# Patient Record
Sex: Female | Born: 1980 | Race: Black or African American | Hispanic: No | Marital: Married | Smoking: Never smoker
Health system: Southern US, Community
[De-identification: ages and names within clinical notes are randomized; demographics above are authoritative.]

## PROBLEM LIST (undated history)

## (undated) DIAGNOSIS — R51 Headache: Secondary | ICD-10-CM

## (undated) DIAGNOSIS — N840 Polyp of corpus uteri: Secondary | ICD-10-CM

## (undated) DIAGNOSIS — Z8742 Personal history of other diseases of the female genital tract: Secondary | ICD-10-CM

## (undated) DIAGNOSIS — N870 Mild cervical dysplasia: Secondary | ICD-10-CM

## (undated) DIAGNOSIS — IMO0002 Reserved for concepts with insufficient information to code with codable children: Secondary | ICD-10-CM

## (undated) DIAGNOSIS — Z87440 Personal history of urinary (tract) infections: Secondary | ICD-10-CM

## (undated) DIAGNOSIS — N83209 Unspecified ovarian cyst, unspecified side: Secondary | ICD-10-CM

## (undated) DIAGNOSIS — Z8619 Personal history of other infectious and parasitic diseases: Secondary | ICD-10-CM

## (undated) DIAGNOSIS — N979 Female infertility, unspecified: Secondary | ICD-10-CM

## (undated) DIAGNOSIS — B379 Candidiasis, unspecified: Secondary | ICD-10-CM

## (undated) DIAGNOSIS — B977 Papillomavirus as the cause of diseases classified elsewhere: Secondary | ICD-10-CM

## (undated) HISTORY — DX: Personal history of urinary (tract) infections: Z87.440

## (undated) HISTORY — DX: Reserved for concepts with insufficient information to code with codable children: IMO0002

## (undated) HISTORY — DX: Personal history of other infectious and parasitic diseases: Z86.19

## (undated) HISTORY — DX: Female infertility, unspecified: N97.9

## (undated) HISTORY — DX: Personal history of other diseases of the female genital tract: Z87.42

## (undated) HISTORY — DX: Polyp of corpus uteri: N84.0

## (undated) HISTORY — DX: Mild cervical dysplasia: N87.0

## (undated) HISTORY — DX: Headache: R51

## (undated) HISTORY — DX: Candidiasis, unspecified: B37.9

## (undated) HISTORY — PX: OTHER SURGICAL HISTORY: SHX169

## (undated) HISTORY — DX: Unspecified ovarian cyst, unspecified side: N83.209

## (undated) HISTORY — DX: Papillomavirus as the cause of diseases classified elsewhere: B97.7

## (undated) HISTORY — PX: WISDOM TOOTH EXTRACTION: SHX21

---

## 1999-11-09 ENCOUNTER — Other Ambulatory Visit: Admission: RE | Admit: 1999-11-09 | Discharge: 1999-11-09 | Payer: Self-pay | Admitting: Family Medicine

## 2000-12-24 ENCOUNTER — Other Ambulatory Visit: Admission: RE | Admit: 2000-12-24 | Discharge: 2000-12-24 | Payer: Self-pay | Admitting: Family Medicine

## 2004-01-26 ENCOUNTER — Ambulatory Visit (HOSPITAL_COMMUNITY): Admission: RE | Admit: 2004-01-26 | Discharge: 2004-01-26 | Payer: Self-pay | Admitting: Obstetrics & Gynecology

## 2004-03-03 ENCOUNTER — Ambulatory Visit (HOSPITAL_COMMUNITY): Admission: RE | Admit: 2004-03-03 | Discharge: 2004-03-03 | Payer: Self-pay | Admitting: Obstetrics & Gynecology

## 2005-02-12 DIAGNOSIS — B977 Papillomavirus as the cause of diseases classified elsewhere: Secondary | ICD-10-CM

## 2005-02-12 DIAGNOSIS — N870 Mild cervical dysplasia: Secondary | ICD-10-CM

## 2005-02-12 HISTORY — DX: Mild cervical dysplasia: N87.0

## 2005-02-12 HISTORY — DX: Papillomavirus as the cause of diseases classified elsewhere: B97.7

## 2005-03-15 DIAGNOSIS — IMO0002 Reserved for concepts with insufficient information to code with codable children: Secondary | ICD-10-CM

## 2005-03-15 HISTORY — DX: Reserved for concepts with insufficient information to code with codable children: IMO0002

## 2005-08-20 ENCOUNTER — Other Ambulatory Visit: Admission: RE | Admit: 2005-08-20 | Discharge: 2005-08-20 | Payer: Self-pay | Admitting: Obstetrics and Gynecology

## 2008-02-13 DIAGNOSIS — Z8742 Personal history of other diseases of the female genital tract: Secondary | ICD-10-CM

## 2008-02-13 HISTORY — DX: Personal history of other diseases of the female genital tract: Z87.42

## 2008-03-02 ENCOUNTER — Encounter: Admission: RE | Admit: 2008-03-02 | Discharge: 2008-03-02 | Payer: Self-pay

## 2009-08-26 ENCOUNTER — Ambulatory Visit (HOSPITAL_COMMUNITY): Admission: RE | Admit: 2009-08-26 | Discharge: 2009-08-26 | Payer: Self-pay | Admitting: Obstetrics and Gynecology

## 2009-09-10 ENCOUNTER — Emergency Department (HOSPITAL_COMMUNITY): Admission: EM | Admit: 2009-09-10 | Discharge: 2009-09-10 | Payer: Self-pay | Admitting: Family Medicine

## 2009-09-16 ENCOUNTER — Ambulatory Visit (HOSPITAL_COMMUNITY): Admission: RE | Admit: 2009-09-16 | Discharge: 2009-09-16 | Payer: Self-pay | Admitting: Obstetrics and Gynecology

## 2010-04-28 LAB — PREGNANCY, URINE: Preg Test, Ur: NEGATIVE

## 2010-04-29 LAB — URINE CULTURE
Colony Count: 50000
Culture  Setup Time: 201107302133

## 2010-04-29 LAB — POCT URINALYSIS DIP (DEVICE)
Bilirubin Urine: NEGATIVE
Glucose, UA: NEGATIVE mg/dL
Ketones, ur: NEGATIVE mg/dL
Nitrite: NEGATIVE
Protein, ur: NEGATIVE mg/dL
Specific Gravity, Urine: 1.02 (ref 1.005–1.030)
Urobilinogen, UA: 0.2 mg/dL (ref 0.0–1.0)
pH: 7 (ref 5.0–8.0)

## 2010-04-29 LAB — POCT PREGNANCY, URINE: Preg Test, Ur: NEGATIVE

## 2010-08-13 ENCOUNTER — Inpatient Hospital Stay (HOSPITAL_COMMUNITY)
Admission: AD | Admit: 2010-08-13 | Discharge: 2010-08-16 | DRG: 373 | Disposition: A | Discharge status: Home / Self Care | Payer: BC Managed Care – PPO | Source: Ambulatory Visit | Attending: Obstetrics and Gynecology | Admitting: Obstetrics and Gynecology

## 2010-08-13 LAB — COMPREHENSIVE METABOLIC PANEL
ALT: 23 U/L (ref 0–35)
AST: 18 U/L (ref 0–37)
Alkaline Phosphatase: 107 U/L (ref 39–117)
CO2: 18 mEq/L — ABNORMAL LOW (ref 19–32)
Calcium: 8.9 mg/dL (ref 8.4–10.5)
Chloride: 99 mEq/L (ref 96–112)
Creatinine, Ser: 0.5 mg/dL (ref 0.50–1.10)
GFR calc Af Amer: >60 mL/min (ref >60)
GFR calc non Af Amer: >60 mL/min (ref >60)
Glucose, Bld: 83 mg/dL (ref 70–99)
Potassium: 3.4 mEq/L — ABNORMAL LOW (ref 3.5–5.1)
Sodium: 132 mEq/L — ABNORMAL LOW (ref 135–145)
Total Protein: 6.8 g/dL (ref 6.0–8.3)

## 2010-08-13 LAB — LACTATE DEHYDROGENASE: LDH: 181 U/L (ref 94–250)

## 2010-08-13 LAB — CBC
HCT: 36.1 % (ref 36.0–46.0)
Hemoglobin: 12.1 g/dL (ref 12.0–15.0)
MCH: 31.4 pg (ref 26.0–34.0)
MCHC: 33.5 g/dL (ref 30.0–36.0)
Platelets: 176 10*3/uL (ref 150–400)
RBC: 3.85 MIL/uL — ABNORMAL LOW (ref 3.87–5.11)
RDW: 13.3 % (ref 11.5–15.5)

## 2010-08-13 LAB — URIC ACID: Uric Acid, Serum: 3.5 mg/dL (ref 2.4–7.0)

## 2010-08-13 LAB — RPR: RPR Ser Ql: NONREACTIVE

## 2010-08-15 LAB — URIC ACID: Uric Acid, Serum: 4 mg/dL (ref 2.4–7.0)

## 2010-08-15 LAB — COMPREHENSIVE METABOLIC PANEL
ALT: 33 U/L (ref 0–35)
AST: 42 U/L — ABNORMAL HIGH (ref 0–37)
Albumin: 2.1 g/dL — ABNORMAL LOW (ref 3.5–5.2)
Alkaline Phosphatase: 80 U/L (ref 39–117)
BUN: 8 mg/dL (ref 6–23)
CO2: 24 mEq/L (ref 19–32)
Calcium: 8.8 mg/dL (ref 8.4–10.5)
Creatinine, Ser: 0.58 mg/dL (ref 0.50–1.10)
GFR calc Af Amer: >60 mL/min (ref >60)
GFR calc non Af Amer: >60 mL/min (ref >60)
Glucose, Bld: 80 mg/dL (ref 70–99)
Potassium: 3.9 mEq/L (ref 3.5–5.1)
Sodium: 136 mEq/L (ref 135–145)
Total Bilirubin: 0.4 mg/dL (ref 0.3–1.2)
Total Protein: 5.7 g/dL — ABNORMAL LOW (ref 6.0–8.3)

## 2010-08-15 LAB — CBC
HCT: 29.9 % — ABNORMAL LOW (ref 36.0–46.0)
Hemoglobin: 9.9 g/dL — ABNORMAL LOW (ref 12.0–15.0)
MCH: 31.6 pg (ref 26.0–34.0)
MCHC: 33.1 g/dL (ref 30.0–36.0)
MCV: 95.5 fL (ref 78.0–100.0)
Platelets: 186 10*3/uL (ref 150–400)
RBC: 3.13 MIL/uL — ABNORMAL LOW (ref 3.87–5.11)
RDW: 13.6 % (ref 11.5–15.5)
WBC: 13 10*3/uL — ABNORMAL HIGH (ref 4.0–10.5)

## 2010-08-15 LAB — LACTATE DEHYDROGENASE: LDH: 268 U/L — ABNORMAL HIGH (ref 94–250)

## 2011-09-04 ENCOUNTER — Other Ambulatory Visit: Payer: Self-pay | Admitting: Obstetrics and Gynecology

## 2011-09-04 NOTE — Telephone Encounter (Signed)
Triage/rx °

## 2011-09-04 NOTE — Telephone Encounter (Signed)
Tc to pt regarding msg, lm on vm to call back. 

## 2011-09-05 ENCOUNTER — Other Ambulatory Visit: Payer: Self-pay | Admitting: Obstetrics and Gynecology

## 2011-09-05 ENCOUNTER — Telehealth: Payer: Self-pay | Admitting: Obstetrics and Gynecology

## 2011-09-05 MED ORDER — NORETHINDRONE 0.35 MG PO TABS: 1.0000 | ORAL_TABLET | Freq: Every day | ORAL | Status: DC

## 2011-09-05 NOTE — Telephone Encounter (Signed)
Pt rtnd call, states needs rf of Camila, was to start Rx on Sunday, has not had any unprotected IC since then, pt sched AEX for Monday 10/01/11, pt given 1 rf to last until appt, pt to double up on pills until she is caught up on pills, pt voices understanding.

## 2011-09-05 NOTE — Telephone Encounter (Signed)
Triage/repeat call about script.

## 2011-09-27 ENCOUNTER — Telehealth: Payer: Self-pay | Admitting: Obstetrics and Gynecology

## 2011-09-27 MED ORDER — NORETHINDRONE 0.35 MG PO TABS: 1.0000 | ORAL_TABLET | Freq: Every day | ORAL | Status: DC

## 2011-09-27 NOTE — Telephone Encounter (Signed)
Spoke with pt Helen Branch msg pt states need refill on bc camilla until appt 10/01/11 informed pt per protocol rx sent to pharm pt voice understanding

## 2011-10-01 ENCOUNTER — Ambulatory Visit (INDEPENDENT_AMBULATORY_CARE_PROVIDER_SITE_OTHER): Payer: BC Managed Care – PPO | Admitting: Obstetrics and Gynecology

## 2011-10-01 ENCOUNTER — Encounter: Payer: Self-pay | Admitting: Obstetrics and Gynecology

## 2011-10-01 VITALS — BP 110/60 | HR 86 | Resp 16 | Ht 68.0 in | Wt 148.0 lb

## 2011-10-01 DIAGNOSIS — IMO0001 Reserved for inherently not codable concepts without codable children: Secondary | ICD-10-CM

## 2011-10-01 DIAGNOSIS — Z124 Encounter for screening for malignant neoplasm of cervix: Secondary | ICD-10-CM

## 2011-10-01 DIAGNOSIS — Z309 Encounter for contraceptive management, unspecified: Secondary | ICD-10-CM

## 2011-10-01 DIAGNOSIS — Z01419 Encounter for gynecological examination (general) (routine) without abnormal findings: Secondary | ICD-10-CM

## 2011-10-01 MED ORDER — NORETHINDRONE 0.35 MG PO TABS: 1.0000 | ORAL_TABLET | Freq: Every day | ORAL | Status: DC

## 2011-10-01 MED ORDER — NORETHINDRONE-ETH ESTRADIOL 0.5-35 MG-MCG PO TABS: 1.0000 | ORAL_TABLET | Freq: Every day | ORAL | Status: AC

## 2011-10-01 NOTE — Progress Notes (Signed)
Regular Periods: yes Mammogram: no  Monthly Breast Ex.: yes Exercise: no  Tetanus < 10 years: no Seatbelts: yes  NI. Bladder Functn.: yes Abuse at home: no  Daily BM's: yes Stressful Work: no "pt starts back tomorrow"  Healthy Diet: yes Sigmoid-Colonoscopy: N/A   Calcium: no Medical problems this year: N/A    LAST PAP:09/26/2010 "WNL"  Contraception: Camilla  Mammogram:  Never  PCP: Rubbie Battiest  PMH: No Changes  FMH: No Changes  Last Bone Scan: Never.  ANNUAL GYNECOLOGIC EXAMINATION   Helen Branch is a 31 y.o. female, G1P1001, who presents for an annual exam. See above. She is doing well.  Prior Hysterectomy: No    History   Social History  . Marital Status: Married    Spouse Name: N/A    Number of Children: N/A  . Years of Education: N/A   Social History Main Topics  . Smoking status: Never Smoker   . Smokeless tobacco: Never Used  . Alcohol Use: Yes  . Drug Use: No  . Sexually Active: Yes    Birth Control/ Protection: Pill   Other Topics Concern  . None   Social History Narrative  . None    Menstrual cycle:   LMP: Patient's last menstrual period was 09/09/2011.             The following portions of the patient's history were reviewed and updated as appropriate: allergies, current medications, past family history, past medical history, past social history, past surgical history and problem list.  Review of Systems Pertinent items are noted in HPI. Breast:Negative for breast lump,nipple discharge or nipple retraction Gastrointestinal: Negative for abdominal pain, change in bowel habits or rectal bleeding Urinary:negative   Objective:    BP 110/60  Pulse 86  Resp 16  Ht 5\' 8"  (1.727 m)  Wt 148 lb (67.132 kg)  BMI 22.50 kg/m2  LMP 09/09/2011  Breastfeeding? Yes    Weight:  Wt Readings from Last 1 Encounters:  10/01/11 148 lb (67.132 kg)          BMI: Body mass index is 22.50 kg/(m^2).  General Appearance: Alert, appropriate  appearance for age. No acute distress HEENT: Grossly normal Neck / Thyroid: Supple, no masses, nodes or enlargement Lungs: clear to auscultation bilaterally Back: No CVA tenderness Breast Exam: No masses or nodes.No dimpling, nipple retraction or discharge. Cardiovascular: Regular rate and rhythm. S1, S2, no murmur Gastrointestinal: Soft, non-tender, no masses or organomegaly  ++++++++++++++++++++++++++++++++++++++++++++++++++++++++  Pelvic Exam: External genitalia: normal general appearance Vaginal: normal without tenderness, induration or masses and relaxation: Yes Cervix: normal appearance Adnexa: normal bimanual exam Uterus: normal size, shape, and consistency Rectovaginal: not indicated  ++++++++++++++++++++++++++++++++++++++++++++++++++++++++  Lymphatic Exam: Non-palpable nodes in neck, clavicular, axillary, or inguinal regions Neurologic: Normal speech, no tremor  Psychiatric: Alert and oriented, appropriate affect.   Wet Prep:   not applicable Urinalysis:  not applicable UPT:           Not done   Assessment:    Normal gyn exam   Overweight or obese: No   Pelvic relaxation: Yes  Contraception... The patient is still breast-feeding.   Plan:    pap smear return annually or prn Contraception:oral contraceptives (estrogen/progesterone), oral progesterone-only contraceptive    Medications prescribed: Micronor for contraception right now.  Switched to Loestrin 1.0/ 20 when she has stopped breast-feeding.  STD screen request: No   The updated Pap smear screening guidelines were discussed with the patient. The patient requested that I obtain a  Pap smear: Yes.  Kegel exercises discussed: Yes.  Proper diet and regular exercise were reviewed.  Annual mammograms recommended starting at age 70. Proper breast care was discussed.  Screening colonoscopy is recommended beginning at age 3.  Regular health maintenance was reviewed.  Sleep hygiene was  discussed.  Adequate calcium and vitamin D intake was emphasized.  Mylinda Latina.D.

## 2011-10-02 LAB — PAP IG W/ RFLX HPV ASCU

## 2012-02-18 ENCOUNTER — Telehealth: Payer: Self-pay | Admitting: Obstetrics and Gynecology

## 2012-02-19 ENCOUNTER — Encounter: Payer: Self-pay | Admitting: Obstetrics and Gynecology

## 2012-02-19 ENCOUNTER — Ambulatory Visit (INDEPENDENT_AMBULATORY_CARE_PROVIDER_SITE_OTHER): Payer: BC Managed Care – PPO | Admitting: Obstetrics and Gynecology

## 2012-02-19 VITALS — BP 122/80 | HR 92 | Wt 153.0 lb

## 2012-02-19 DIAGNOSIS — N912 Amenorrhea, unspecified: Secondary | ICD-10-CM

## 2012-02-19 LAB — POCT URINE PREGNANCY: Preg Test, Ur: NEGATIVE

## 2012-02-19 NOTE — Progress Notes (Signed)
HISTORY OF PRESENT ILLNESS  Ms. Helen Branch is a 32 y.o. year old female,G1P1001, who presents for a problem visit. She is currently using Necon 0.5/35 birth control pills.  Subjective:  She reports not having her cycle since January 10, 2012.  Objective:  BP 122/80  Pulse 92  Wt 153 lb (69.4 kg)  LMP 01/10/2012   GI: soft and nontender  External genitalia: normal general appearance Vaginal: normal without tenderness, induration or masses Cervix: nontender Adnexa: normal bimanual exam Uterus: normal size shape and consistency  Pregnancy test: Negative  Assessment:  A menorrhea secondary to birth control pills  Plan:  Options for management were reviewed.  The patient was told that she can continue her current pills and that she may experience amenorrhea.  We can switch her pills so that she has a cycle each month.  She elects to continue her current pills.  Return to office in 7 month(s).   Leonard Schwartz M.D.  02/19/2012 9:40 AM

## 2013-12-14 ENCOUNTER — Encounter: Payer: Self-pay | Admitting: Obstetrics and Gynecology

## 2014-03-04 ENCOUNTER — Other Ambulatory Visit: Payer: Self-pay

## 2014-03-15 ENCOUNTER — Other Ambulatory Visit: Payer: Self-pay | Admitting: Specialist

## 2014-03-15 ENCOUNTER — Ambulatory Visit
Admission: RE | Admit: 2014-03-15 | Discharge: 2014-03-15 | Disposition: A | Discharge status: Home / Self Care | Payer: BC Managed Care – PPO | Source: Ambulatory Visit | Attending: Specialist | Admitting: Specialist

## 2014-03-15 DIAGNOSIS — R21 Rash and other nonspecific skin eruption: Secondary | ICD-10-CM

## 2014-03-15 DIAGNOSIS — D869 Sarcoidosis, unspecified: Secondary | ICD-10-CM

## 2014-12-15 ENCOUNTER — Ambulatory Visit (INDEPENDENT_AMBULATORY_CARE_PROVIDER_SITE_OTHER): Payer: BC Managed Care – PPO | Admitting: Primary Care

## 2014-12-15 ENCOUNTER — Encounter: Payer: Self-pay | Admitting: Primary Care

## 2014-12-15 VITALS — BP 118/82 | HR 94 | Temp 98.2°F | Ht 68.0 in | Wt 153.1 lb

## 2014-12-15 DIAGNOSIS — N898 Other specified noninflammatory disorders of vagina: Secondary | ICD-10-CM

## 2014-12-15 DIAGNOSIS — R51 Headache: Secondary | ICD-10-CM

## 2014-12-15 DIAGNOSIS — R519 Headache, unspecified: Secondary | ICD-10-CM

## 2014-12-15 NOTE — Patient Instructions (Addendum)
I will send off your specimen and notify you of your results tomorrow.  Please schedule a physical with me after the new year. You will also schedule a lab only appointment one week prior. We will discuss your lab results during your physical.  It was a pleasure to meet you today! Please don't hesitate to call me with any questions. Welcome to [facility]!

## 2014-12-15 NOTE — Progress Notes (Signed)
Subjective:    Patient ID: Helen BarlowLashanda R Branch, female    DOB: August 05, 1980, 34 y.o.   MRN: [id number]  HPI  Ms. Helen Branch is a 34 year old female who presents today to establish care and discuss the problems mentioned below. Will obtain old records. Her last physical was several years ago, she follows with GYN annually.   1) Vaginal Discharge: She first noticed her discharge about 10 days ago. Her discharge was an off white color. She then had her menstrual cycle in between, completed her cycle, and continued to notice the discharge and also a foul smell. She used an OTC applicator once for symptoms without relief. Denies vaginal itching, abdominal pain. She did notice some urinary frequency 1 week ago but denies symptoms now along with other urinary symptoms.    Review of Systems  Constitutional: Negative for unexpected weight change.  HENT: Negative for rhinorrhea.   Respiratory: Negative for cough and shortness of breath.   Cardiovascular: Negative for chest pain.  Gastrointestinal: Negative for abdominal pain, diarrhea and constipation.  Genitourinary: Positive for vaginal discharge and pelvic pain. Negative for frequency, hematuria, flank pain and difficulty urinating.       Regular periods  Musculoskeletal: Negative for myalgias and arthralgias.  Skin: Negative for rash.  Neurological: Negative for dizziness, numbness and headaches.  Psychiatric/Behavioral:       Denies concerns for anxiety and depression       Past Medical History  Diagnosis Date  . Infertility, female   . Endometrial polyp   . Abnormal Pap smear 03/2005  . H/O candidiasis   . H/O varicella   . Hx: UTI (urinary tract infection)   . Headache(784.0)   . Ovarian cyst   . Yeast infection   . CIN I (cervical intraepithelial neoplasia I) 2007  . HPV in female 2007  . H/O dysmenorrhea 2010    Social History   Social History  . Marital Status: Married    Spouse Name: N/A  . Number of Children: N/A  . Years of  Education: N/A   Occupational History  . Not on file.   Social History Main Topics  . Smoking status: Never Smoker   . Smokeless tobacco: Never Used  . Alcohol Use: Yes  . Drug Use: No  . Sexual Activity: Yes    Birth Control/ Protection: Abstinence   Other Topics Concern  . Not on file   Social History Narrative   Married.   1 child.   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   Enjoys spending time with her family.     Past Surgical History  Procedure Laterality Date  . Wisdom tooth extraction      x 4  . 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 Adopted: Yes  . Other      adopted    Allergies  Allergen Reactions  . Eggs Or Egg-Derived Products     Current Outpatient Prescriptions on File Prior to Visit  Medication Sig Dispense Refill  . norethindrone-ethinyl estradiol (NECON 0.5/35, 28,) 0.5-35 MG-MCG tablet Take 1 tablet by mouth daily. 3 Package 11   No current facility-administered medications on file prior to visit.    BP 118/82 mmHg  Pulse 94  Temp(Src) 98.2 F (36.8 C) (Oral)  Ht 5\' 8"  (1.727 m)  Wt 153 lb 1.9 oz (69.455 kg)  BMI 23.29 kg/m2  SpO2 98%  LMP 12/09/2014    Objective:   Physical Exam  Constitutional: She is oriented to person, place, and time. She appears well-nourished.  Cardiovascular: Normal rate and regular rhythm.   Pulmonary/Chest: Effort normal and breath sounds normal.  Abdominal: Soft. Bowel sounds are normal. There is no tenderness.  Genitourinary: Cervix exhibits discharge. Cervix exhibits no motion tenderness. Right adnexum displays no tenderness. Left adnexum displays no tenderness. No erythema in the vagina. Vaginal discharge found.  Neurological: She is alert and oriented to person, place, and time.  Skin: Skin is warm and dry.  Psychiatric: She has a normal mood and affect.          Assessment & Plan:  Vaginal  Discharge:  Whitish discharge for 10 days with foul smell. No itching, pain, urinary symptoms. Pelvic exam reveals moderate amount of whitish discharge. No CMT. Wet Prep completed and sent off for testing. Will treat pending test.

## 2014-12-15 NOTE — Assessment & Plan Note (Signed)
Chronic for years. Occur several days weekly, will take aleve with relief. Currently not managed on preventative treatment.

## 2014-12-15 NOTE — Addendum Note (Signed)
Addended by: Tawnya CrookSAMBATH, Sharlene Mccluskey on: 12/15/2014 03:28 PM   Modules accepted: Orders

## 2014-12-15 NOTE — Progress Notes (Signed)
Pre visit review using our clinic review tool, if applicable. No additional management support is needed unless otherwise documented below in the visit note. 

## 2014-12-16 ENCOUNTER — Telehealth: Payer: Self-pay | Admitting: Primary Care

## 2014-12-16 LAB — WET PREP BY MOLECULAR PROBE
Candida species: NEGATIVE
GARDNERELLA VAGINALIS: NEGATIVE
Trichomonas vaginosis: NEGATIVE

## 2014-12-16 LAB — GC/CHLAMYDIA PROBE AMP
CT PROBE, AMP APTIMA: NEGATIVE
GC Probe RNA: NEGATIVE

## 2014-12-16 NOTE — Telephone Encounter (Signed)
Patient called to find out her lab results.  Please call patient when lab report is done.

## 2014-12-17 ENCOUNTER — Telehealth: Payer: Self-pay | Admitting: Primary Care

## 2014-12-17 NOTE — Telephone Encounter (Signed)
Please notify Helen Branch that her labs were negative for yeast, bacteria, trichomonas, gonorrhea, and chlamydia.

## 2014-12-17 NOTE — Telephone Encounter (Signed)
Called and notified patient of Kate's comments. Patient verbalized understanding.  

## 2014-12-17 NOTE — Telephone Encounter (Signed)
Will you please check on her results? Thanks!

## 2018-03-04 ENCOUNTER — Ambulatory Visit
Admission: RE | Admit: 2018-03-04 | Discharge: 2018-03-04 | Disposition: A | Discharge status: Home / Self Care | Payer: BC Managed Care – PPO | Source: Ambulatory Visit | Attending: Primary Care | Admitting: Primary Care

## 2018-03-04 ENCOUNTER — Encounter: Payer: Self-pay | Admitting: Primary Care

## 2018-03-04 ENCOUNTER — Encounter (INDEPENDENT_AMBULATORY_CARE_PROVIDER_SITE_OTHER): Payer: Self-pay

## 2018-03-04 ENCOUNTER — Other Ambulatory Visit: Payer: Self-pay | Admitting: Primary Care

## 2018-03-04 ENCOUNTER — Ambulatory Visit: Payer: BC Managed Care – PPO | Admitting: Primary Care

## 2018-03-04 VITALS — BP 116/82 | HR 94 | Temp 98.2°F | Ht 68.0 in | Wt 162.8 lb

## 2018-03-04 DIAGNOSIS — N644 Mastodynia: Secondary | ICD-10-CM | POA: Diagnosis not present

## 2018-03-04 DIAGNOSIS — N63 Unspecified lump in unspecified breast: Secondary | ICD-10-CM

## 2018-03-04 LAB — POCT URINE PREGNANCY: Preg Test, Ur: NEGATIVE

## 2018-03-04 NOTE — Patient Instructions (Signed)
Stop by the front desk and speak with Zella Ball regarding your mammogram and ultrasounds.  It was a pleasure to see you today! Please don't hesitate to call or message me with any questions. Welcome back to [facility]!

## 2018-03-04 NOTE — Assessment & Plan Note (Signed)
Intermittent over the last 12 months, more noticeable over last 6 months. Exam overall benign. Given persistent symptoms without obvious cause on exam, will obtain diagnostic mammogram with bilateral ultrasound. Urine pregnancy negative. Consider MSK as a cause, discussed this with patient.

## 2018-03-04 NOTE — Progress Notes (Signed)
Subjective:    Patient ID: Helen Branch, female    DOB: 1980-06-05, 38 y.o.   MRN: [id number]  HPI  Helen Branch is a 39 year old female who presents today to re-establish care and discuss the problems mentioned below.  1) Breast Tenderness: Intermittent that has been present for the last 12 months, more noticeable over the last 6 months. She will experience a "pain" to the top and right side of the breast, sometimes around the nipple region. She denies changes in skin texture of her breast, erythema, swelling to the axilla. The breast does seem heavier at times.   She was adopted so is unsure of her family history. She is following with GYN and is taking OPC's daily. She has missed one pill one month ago, was able to double up. LMP was in late December 2019, more spotting.   Review of Systems  Constitutional: Negative for fever.  Respiratory: Negative for shortness of breath.   Cardiovascular: Negative for chest pain.  Genitourinary: Negative for menstrual problem.  Skin: Negative for color change.       Past Medical History:  Diagnosis Date  . Abnormal Pap smear 03/2005  . CIN I (cervical intraepithelial neoplasia I) 2007  . Endometrial polyp   . H/O candidiasis   . H/O dysmenorrhea 2010  . H/O varicella   . Headache(784.0)   . HPV in female 2007  . Hx: UTI (urinary tract infection)   . Infertility, female   . Ovarian cyst   . Yeast infection      Social History   Socioeconomic History  . Marital status: Married    Spouse name: Not on file  . Number of children: Not on file  . Years of education: Not on file  . Highest education level: Not on file  Occupational History  . Not on file  Social Needs  . Financial resource strain: Not on file  . Food insecurity:    Worry: Not on file    Inability: Not on file  . Transportation needs:    Medical: Not on file    Non-medical: Not on file  Tobacco Use  . Smoking status: Never Smoker  . Smokeless tobacco: Never  Used  Substance and Sexual Activity  . Alcohol use: Yes  . Drug use: No  . Sexual activity: Yes    Birth control/protection: Abstinence  Lifestyle  . Physical activity:    Days per week: Not on file    Minutes per session: Not on file  . Stress: Not on file  Relationships  . Social connections:    Talks on phone: Not on file    Gets together: Not on file    Attends religious service: Not on file    Active member of club or organization: Not on file    Attends meetings of clubs or organizations: Not on file    Relationship status: Not on file  . Intimate partner violence:    Fear of current or ex partner: Not on file    Emotionally abused: Not on file    Physically abused: Not on file    Forced sexual activity: Not on file  Other Topics Concern  . Not on file  Social History Narrative   Married.   1 child.   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   Enjoys spending time with her family.     Past Surgical History:  Procedure Laterality Date  . 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 WISDOM TOOTH EXTRACTION     x 4    Family History  Adopted: Yes  Problem Relation Age of Onset  . Other Other        adopted    Allergies  Allergen Reactions  . Eggs Or Egg-Derived Products     Current Outpatient Medications on File Prior to Visit  Medication Sig Dispense Refill  . azithromycin (ZITHROMAX) 500 MG tablet Take on tablet by mouth daily for 3 consecutive days each week for 8 weeks    . norethindrone-ethinyl estradiol (NECON 0.5/35, 28,) 0.5-35 MG-MCG tablet Take 1 tablet by mouth daily. 3 Package 11   No current facility-administered medications on file prior to visit.     BP 116/82   Pulse 94   Temp 98.2 F (36.8 C) (Oral)   Ht 5\' 8"  (1.727 m)   Wt 162 lb 12 oz (73.8 kg)   LMP 02/09/2018   SpO2 98%   BMI 24.75 kg/m    Objective:   Physical Exam  Constitutional: She appears well-nourished.  Cardiovascular: Normal rate and  regular rhythm.  Respiratory: Effort normal and breath sounds normal. Right breast exhibits no mass, no nipple discharge and no skin change. Left breast exhibits no mass, no nipple discharge and no skin change.    Skin: Skin is warm and dry. No erythema.           Assessment & Plan:

## 2018-09-09 ENCOUNTER — Ambulatory Visit
Admission: RE | Admit: 2018-09-09 | Discharge: 2018-09-09 | Disposition: A | Discharge status: Home / Self Care | Payer: BC Managed Care – PPO | Source: Ambulatory Visit | Attending: Primary Care | Admitting: Primary Care

## 2018-09-09 ENCOUNTER — Other Ambulatory Visit: Payer: Self-pay | Admitting: Primary Care

## 2018-09-09 ENCOUNTER — Other Ambulatory Visit: Payer: Self-pay

## 2018-09-09 DIAGNOSIS — N63 Unspecified lump in unspecified breast: Secondary | ICD-10-CM

## 2019-03-13 ENCOUNTER — Ambulatory Visit
Admission: RE | Admit: 2019-03-13 | Discharge: 2019-03-13 | Disposition: A | Discharge status: Home / Self Care | Payer: BC Managed Care – PPO | Source: Ambulatory Visit | Attending: Primary Care | Admitting: Primary Care

## 2019-03-13 ENCOUNTER — Other Ambulatory Visit: Payer: Self-pay

## 2019-03-13 DIAGNOSIS — N63 Unspecified lump in unspecified breast: Secondary | ICD-10-CM

## 2020-02-17 IMAGING — MG DIGITAL DIAGNOSTIC BILATERAL MAMMOGRAM WITH TOMO AND CAD
8 of 16 series · 8 of 40 positions shown · non-contrast
Comparison: Previous exam(s).

CLINICAL DATA: Patient presents for tenderness within the
upper-outer right breast.

EXAM:
DIGITAL DIAGNOSTIC BILATERAL MAMMOGRAM WITH CAD AND TOMO
ULTRASOUND BILATERAL BREAST

[L MLO synth-2D]
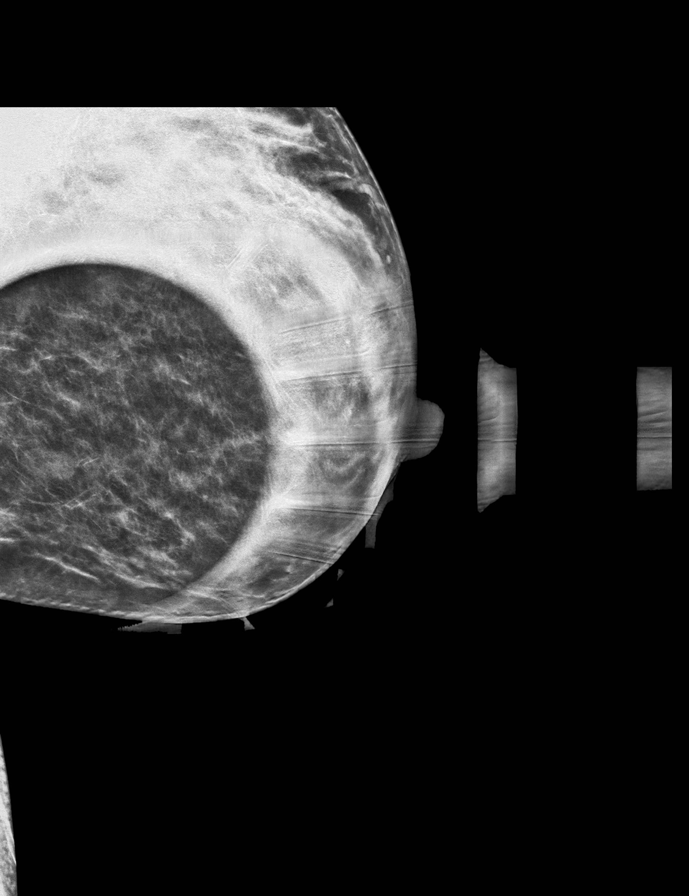

[L CC synth-2D]
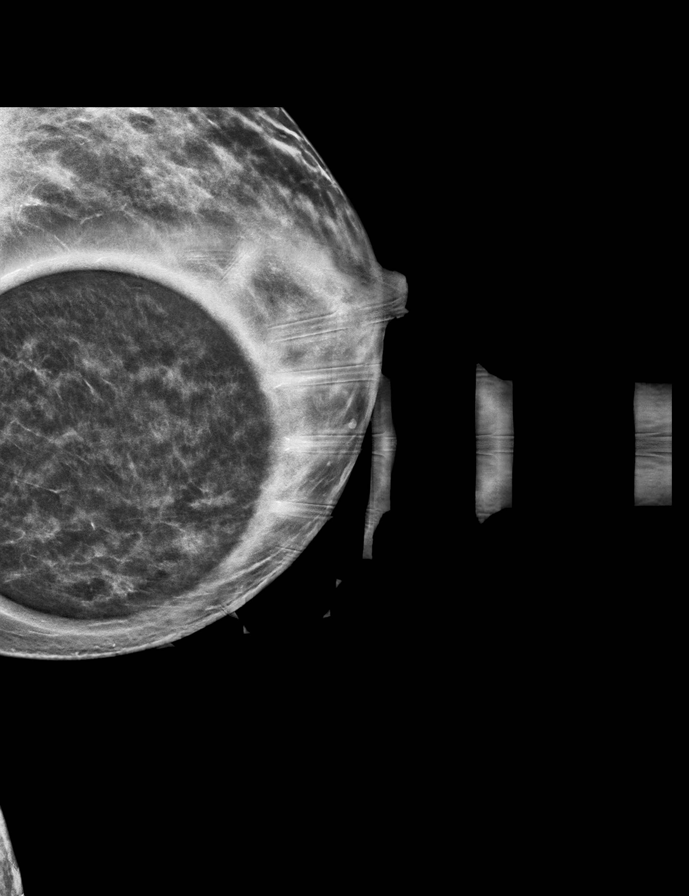

[R CC synth-2D (1 of 2)]
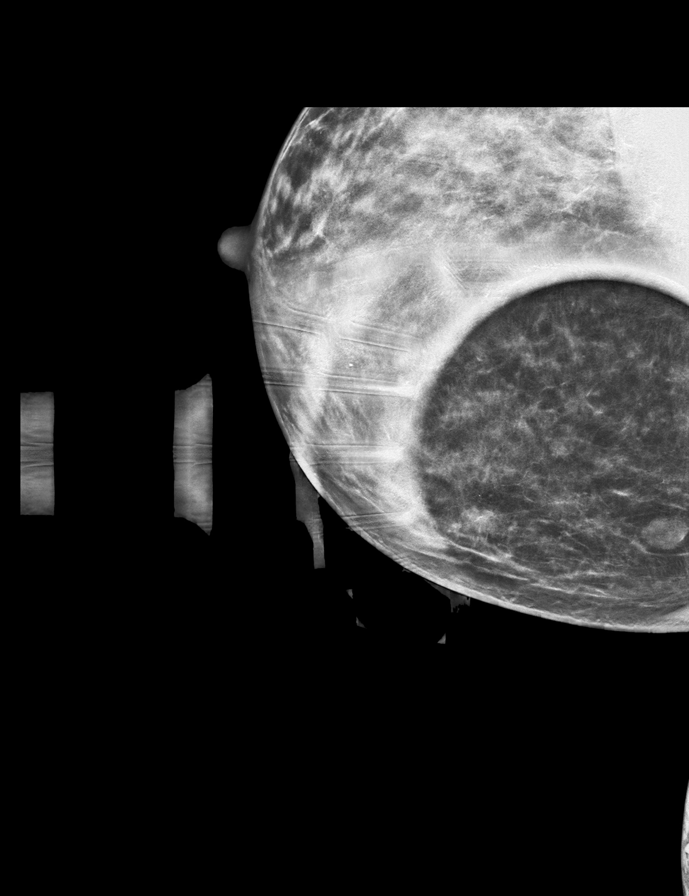

[R MLO synth-2D (1 of 3)]
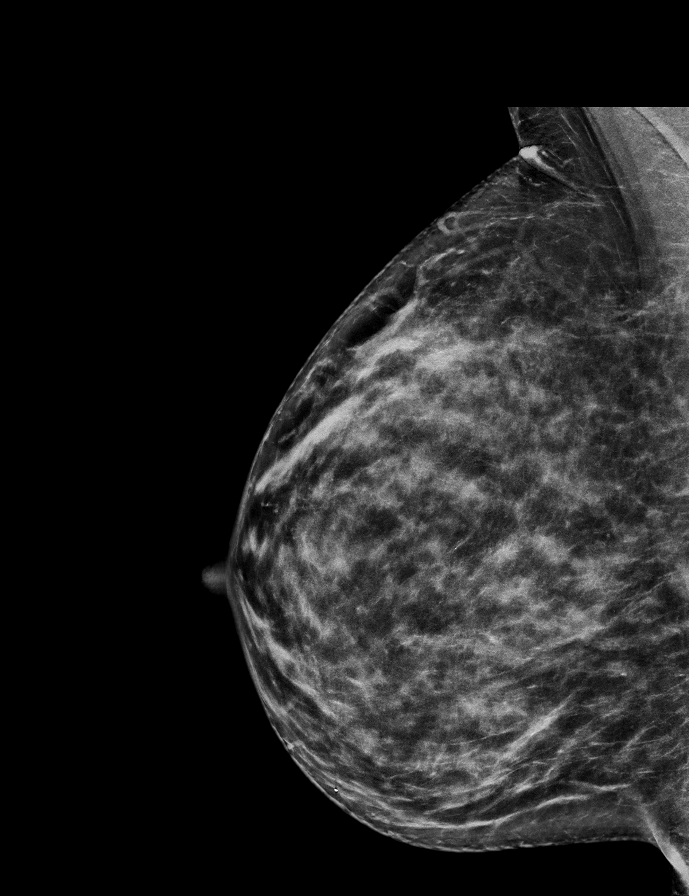

[R MLO synth-2D (2 of 3)]
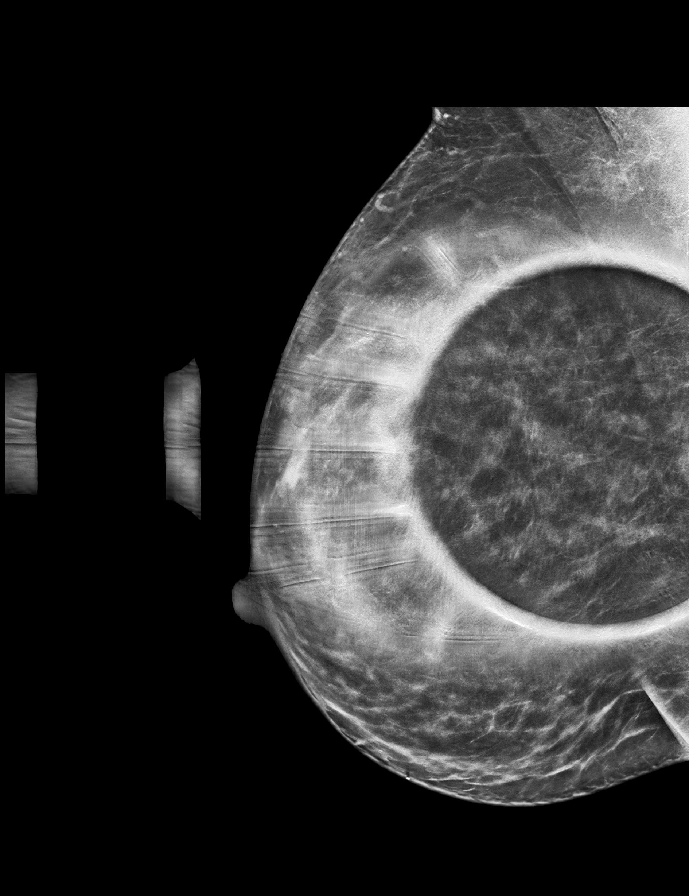

[R CC synth-2D (2 of 2)]
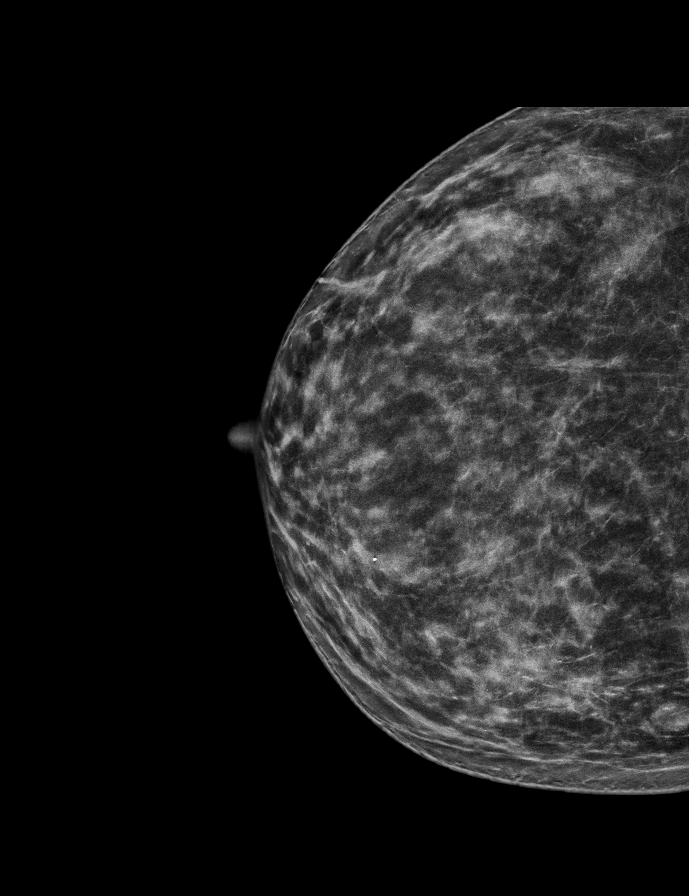

[R MLO synth-2D (3 of 3)]
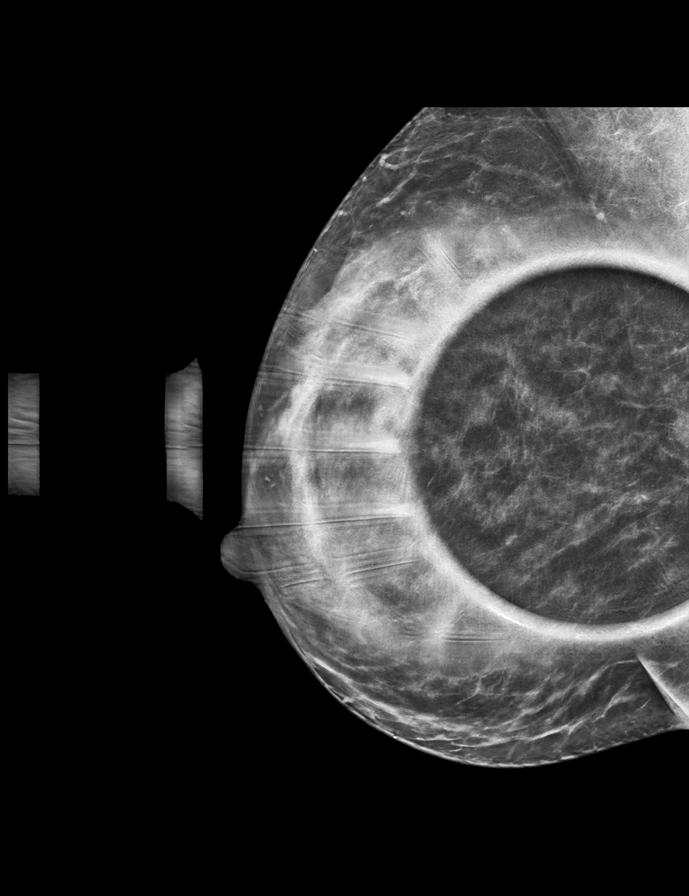

[R MLO tomo · tomo slice 34/67.0]
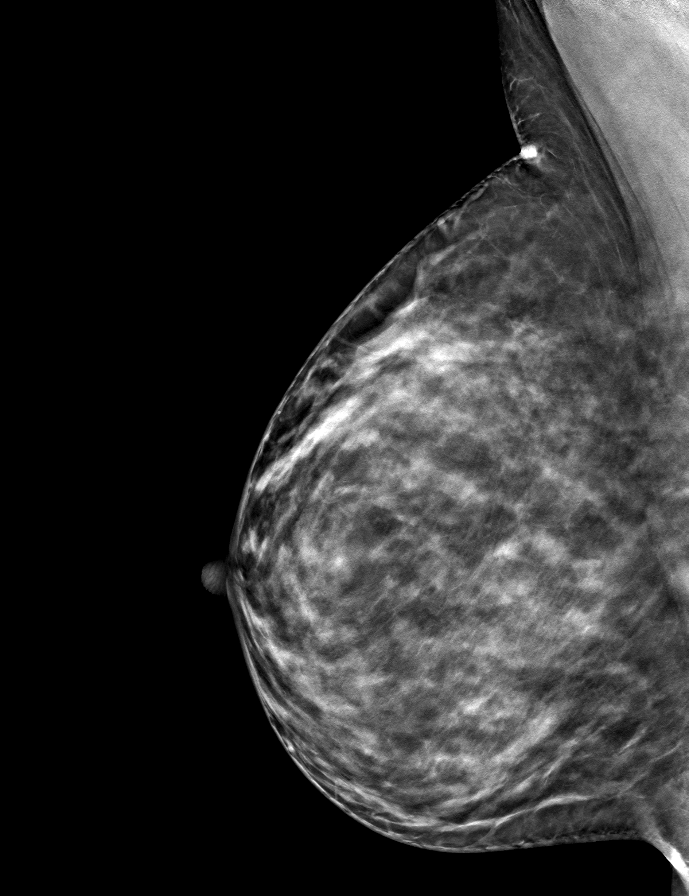

[8 of 40 positions shown; findings below may reference images not displayed]

ACR Breast Density Category c: The breast tissue is heterogeneously
dense, which may obscure small masses.
FINDINGS: Within the medial aspect of the right breast posterior depth there
is an oval circumscribed mass.

Within the lower inner left breast middle depth there is an oval
circumscribed mass.

No additional concerning masses, calcifications or distortion
identified within either breast.

Mammographic images were processed with CAD.

Targeted ultrasound is performed, showing a 1.0 x 0.4 x 1.1 cm oval
circumscribed hypoechoic mass right breast 2 o'clock position 9 cm
from the nipple.

There is a 0.8 x 0.4 x 0.7 cm lobular hypoechoic mass right breast
10 o'clock position 1 cm from the nipple. This is located at the
site of tenderness.

Within the left breast 9 o'clock position 6 cm from nipple there is
a 1.3 x 0.3 x 1.3 cm oval circumscribed hypoechoic mass.
IMPRESSION: Probably benign bilateral breast masses, favored to represent
fibroadenomas.

RECOMMENDATION:
Bilateral diagnostic mammography and ultrasound in 6 months to
ensure stability of probably benign bilateral breast masses.

I have discussed the findings and recommendations with the patient.
Results were also provided in writing at the conclusion of the
visit. If applicable, a reminder letter will be sent to the patient
regarding the next appointment.

BI-RADS CATEGORY  3: Probably benign.

## 2020-02-29 ENCOUNTER — Ambulatory Visit: Payer: BC Managed Care – PPO | Admitting: Primary Care

## 2020-07-27 ENCOUNTER — Other Ambulatory Visit: Payer: Self-pay | Admitting: Primary Care

## 2020-07-27 DIAGNOSIS — Z1231 Encounter for screening mammogram for malignant neoplasm of breast: Secondary | ICD-10-CM

## 2020-09-08 ENCOUNTER — Other Ambulatory Visit: Payer: Self-pay

## 2020-09-28 ENCOUNTER — Other Ambulatory Visit: Payer: Self-pay | Admitting: Primary Care

## 2020-09-28 DIAGNOSIS — N63 Unspecified lump in unspecified breast: Secondary | ICD-10-CM

## 2020-10-03 ENCOUNTER — Other Ambulatory Visit: Payer: Self-pay

## 2020-10-03 ENCOUNTER — Ambulatory Visit
Admission: RE | Admit: 2020-10-03 | Discharge: 2020-10-03 | Disposition: A | Discharge status: Home / Self Care | Payer: BC Managed Care – PPO | Source: Ambulatory Visit | Attending: Primary Care | Admitting: Primary Care

## 2020-10-03 DIAGNOSIS — N63 Unspecified lump in unspecified breast: Secondary | ICD-10-CM

## 2021-11-07 ENCOUNTER — Other Ambulatory Visit: Payer: Self-pay | Admitting: Primary Care

## 2021-11-07 ENCOUNTER — Other Ambulatory Visit: Payer: Self-pay | Admitting: Internal Medicine

## 2021-11-07 DIAGNOSIS — Z1231 Encounter for screening mammogram for malignant neoplasm of breast: Secondary | ICD-10-CM

## 2021-11-15 ENCOUNTER — Ambulatory Visit
Admission: RE | Admit: 2021-11-15 | Discharge: 2021-11-15 | Disposition: A | Discharge status: Home / Self Care | Payer: BC Managed Care – PPO | Source: Ambulatory Visit | Attending: Internal Medicine | Admitting: Internal Medicine

## 2021-11-15 DIAGNOSIS — Z1231 Encounter for screening mammogram for malignant neoplasm of breast: Secondary | ICD-10-CM

## 2023-05-30 ENCOUNTER — Other Ambulatory Visit: Payer: Self-pay | Admitting: Internal Medicine

## 2023-05-30 DIAGNOSIS — Z1231 Encounter for screening mammogram for malignant neoplasm of breast: Secondary | ICD-10-CM

## 2023-05-31 ENCOUNTER — Ambulatory Visit
Admission: RE | Admit: 2023-05-31 | Discharge: 2023-05-31 | Disposition: A | Discharge status: Home / Self Care | Source: Ambulatory Visit | Attending: Internal Medicine | Admitting: Internal Medicine

## 2023-05-31 DIAGNOSIS — Z1231 Encounter for screening mammogram for malignant neoplasm of breast: Secondary | ICD-10-CM
